# Patient Record
Sex: Male | Born: 1965 | Race: White | Hispanic: No | Marital: Single | Smoking: Never smoker
Health system: Southern US, Community
[De-identification: ages and names within clinical notes are randomized; demographics above are authoritative.]

---

## 2015-08-21 ENCOUNTER — Encounter (HOSPITAL_COMMUNITY): Payer: Self-pay | Admitting: Emergency Medicine

## 2015-08-21 ENCOUNTER — Emergency Department (HOSPITAL_COMMUNITY): Payer: No Typology Code available for payment source

## 2015-08-21 ENCOUNTER — Emergency Department (HOSPITAL_COMMUNITY)
Admission: EM | Admit: 2015-08-21 | Discharge: 2015-08-21 | Disposition: A | Discharge status: Home / Self Care | Payer: No Typology Code available for payment source | Attending: Emergency Medicine | Admitting: Emergency Medicine

## 2015-08-21 DIAGNOSIS — S6992XA Unspecified injury of left wrist, hand and finger(s), initial encounter: Secondary | ICD-10-CM | POA: Diagnosis present

## 2015-08-21 DIAGNOSIS — S62102A Fracture of unspecified carpal bone, left wrist, initial encounter for closed fracture: Secondary | ICD-10-CM | POA: Diagnosis not present

## 2015-08-21 DIAGNOSIS — Y9389 Activity, other specified: Secondary | ICD-10-CM | POA: Diagnosis not present

## 2015-08-21 DIAGNOSIS — Y92410 Unspecified street and highway as the place of occurrence of the external cause: Secondary | ICD-10-CM | POA: Insufficient documentation

## 2015-08-21 DIAGNOSIS — Z23 Encounter for immunization: Secondary | ICD-10-CM | POA: Insufficient documentation

## 2015-08-21 DIAGNOSIS — S20212A Contusion of left front wall of thorax, initial encounter: Secondary | ICD-10-CM | POA: Diagnosis not present

## 2015-08-21 DIAGNOSIS — Y999 Unspecified external cause status: Secondary | ICD-10-CM | POA: Insufficient documentation

## 2015-08-21 MED ORDER — TETANUS-DIPHTH-ACELL PERTUSSIS 5-2.5-18.5 LF-MCG/0.5 IM SUSP
0.5000 mL | Freq: Once | INTRAMUSCULAR | Status: AC
  Administered 2015-08-21: 0.5 mL via INTRAMUSCULAR
  Filled 2015-08-21: qty 0.5

## 2015-08-21 MED ORDER — IBUPROFEN 800 MG PO TABS: 800.0000 mg | ORAL_TABLET | Freq: Three times a day (TID) | ORAL | Status: AC

## 2015-08-21 MED ORDER — ACETAMINOPHEN 500 MG PO TABS
1000.0000 mg | ORAL_TABLET | Freq: Once | ORAL | Status: AC
  Administered 2015-08-21: 1000 mg via ORAL
  Filled 2015-08-21: qty 2
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
## 2015-08-21 NOTE — ED Notes (Signed)
To x-ray via stretcher.
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
